# Patient Record
Sex: Male | Born: 1955 | Race: Black or African American | Hispanic: No | Marital: Single | Smoking: Former smoker
Health system: Southern US, Community
[De-identification: ages and names within clinical notes are randomized; demographics above are authoritative.]

## PROBLEM LIST (undated history)

## (undated) DIAGNOSIS — M79673 Pain in unspecified foot: Secondary | ICD-10-CM

## (undated) DIAGNOSIS — Z6827 Body mass index (BMI) 27.0-27.9, adult: Secondary | ICD-10-CM

## (undated) DIAGNOSIS — R413 Other amnesia: Secondary | ICD-10-CM

## (undated) DIAGNOSIS — I209 Angina pectoris, unspecified: Secondary | ICD-10-CM

## (undated) DIAGNOSIS — B351 Tinea unguium: Secondary | ICD-10-CM

## (undated) DIAGNOSIS — E559 Vitamin D deficiency, unspecified: Secondary | ICD-10-CM

## (undated) DIAGNOSIS — H269 Unspecified cataract: Secondary | ICD-10-CM

## (undated) DIAGNOSIS — F32A Depression, unspecified: Secondary | ICD-10-CM

## (undated) DIAGNOSIS — F411 Generalized anxiety disorder: Secondary | ICD-10-CM

## (undated) DIAGNOSIS — E663 Overweight: Secondary | ICD-10-CM

## (undated) DIAGNOSIS — F1410 Cocaine abuse, uncomplicated: Secondary | ICD-10-CM

## (undated) HISTORY — DX: Other amnesia: R41.3

## (undated) HISTORY — DX: Vitamin D deficiency, unspecified: E55.9

## (undated) HISTORY — DX: Generalized anxiety disorder: F41.1

## (undated) HISTORY — DX: Angina pectoris, unspecified: I20.9

## (undated) HISTORY — DX: Pain in unspecified foot: M79.673

## (undated) HISTORY — DX: Unspecified cataract: H26.9

## (undated) HISTORY — DX: Overweight: E66.3

## (undated) HISTORY — DX: Cocaine abuse, uncomplicated: F14.10

## (undated) HISTORY — DX: Tinea unguium: B35.1

## (undated) HISTORY — DX: Body mass index (BMI) 27.0-27.9, adult: Z68.27

## (undated) HISTORY — DX: Depression, unspecified: F32.A

---

## 2010-01-30 ENCOUNTER — Emergency Department (HOSPITAL_COMMUNITY): Admission: EM | Admit: 2010-01-30 | Discharge: 2010-01-30 | Payer: Self-pay | Admitting: Emergency Medicine

## 2019-05-28 ENCOUNTER — Other Ambulatory Visit: Payer: Self-pay | Admitting: *Deleted

## 2019-05-28 DIAGNOSIS — Z20822 Contact with and (suspected) exposure to covid-19: Secondary | ICD-10-CM

## 2019-05-28 NOTE — Progress Notes (Signed)
lab

## 2019-05-30 LAB — NOVEL CORONAVIRUS, NAA: SARS-CoV-2, NAA: NOT DETECTED

## 2019-05-31 NOTE — Progress Notes (Signed)
COVID Hotel Screening performed. COVID screening, temperature, and need for medical care and medications assessed. Patient agreed to the COVID-19 testing. No additional needs assessed at this time.  Kalysta Kneisley RN MSN 

## 2019-07-01 ENCOUNTER — Other Ambulatory Visit: Payer: Self-pay | Admitting: *Deleted

## 2019-07-01 DIAGNOSIS — Z20822 Contact with and (suspected) exposure to covid-19: Secondary | ICD-10-CM

## 2019-07-01 NOTE — Progress Notes (Signed)
la 

## 2019-07-01 NOTE — Addendum Note (Signed)
Addended by: Brigitte Pulse on: 07/01/2019 03:35 PM   Modules accepted: Orders

## 2019-07-03 NOTE — Addendum Note (Signed)
Addended by: Brigitte Pulse on: 07/03/2019 10:20 AM   Modules accepted: Orders

## 2020-01-28 ENCOUNTER — Other Ambulatory Visit: Payer: Self-pay

## 2020-01-28 DIAGNOSIS — Z20822 Contact with and (suspected) exposure to covid-19: Secondary | ICD-10-CM

## 2020-01-30 LAB — NOVEL CORONAVIRUS, NAA: SARS-CoV-2, NAA: NOT DETECTED

## 2020-06-05 ENCOUNTER — Emergency Department (HOSPITAL_COMMUNITY)
Admission: EM | Admit: 2020-06-05 | Discharge: 2020-06-06 | Disposition: A | Discharge status: Home / Self Care | Payer: Self-pay | Attending: Emergency Medicine | Admitting: Emergency Medicine

## 2020-06-05 DIAGNOSIS — F1494 Cocaine use, unspecified with cocaine-induced mood disorder: Secondary | ICD-10-CM | POA: Diagnosis present

## 2020-06-05 DIAGNOSIS — Z20822 Contact with and (suspected) exposure to covid-19: Secondary | ICD-10-CM | POA: Insufficient documentation

## 2020-06-05 DIAGNOSIS — R479 Unspecified speech disturbances: Secondary | ICD-10-CM | POA: Insufficient documentation

## 2020-06-05 DIAGNOSIS — R45851 Suicidal ideations: Secondary | ICD-10-CM | POA: Insufficient documentation

## 2020-06-05 DIAGNOSIS — Z5900 Homelessness unspecified: Secondary | ICD-10-CM

## 2020-06-05 DIAGNOSIS — Z1330 Encounter for screening examination for mental health and behavioral disorders, unspecified: Secondary | ICD-10-CM | POA: Insufficient documentation

## 2020-06-05 NOTE — ED Provider Notes (Signed)
[facility] DEPT Provider Note   CSN: [id number] Arrival date & time: 06/05/20  2217     History Chief Complaint  Patient presents with  . Suicidal    [name] is a 64 y.o. male with no significant past medical history who presents for evaluation under IVC.  Patient unwilling to answer questions.  Per IVC patient stating to DSS/ SW that he was going to slit his throat with a knife. Apparently banged his head on the wall 3 times for police in attempts to harm himself.   Level 5 Caveat- Patient unwilling to participate in exam or answer questions   HPI     History reviewed. No pertinent past medical history.  There are no problems to display for this patient.  History reviewed.    No family history on file.  Social History   Tobacco Use  . Smoking status: Not on file  Substance Use Topics  . Alcohol use: Not on file  . Drug use: Not on file    Home Medications Prior to Admission medications   Not on File    Allergies    Patient has no allergy information on record.  Review of Systems   Review of Systems  Unable to perform ROS: Patient nonverbal  All other systems reviewed and are negative.   Physical Exam Updated Vital Signs BP (!) 120/95 (BP Location: Right Arm)   Pulse 88   Temp 98.4 F (36.9 C) (Oral)   Resp 16   Ht 5\' 6"  (1.676 m)   Wt 77.1 kg   SpO2 98%   BMI 27.44 kg/m   Physical Exam Vitals and nursing note reviewed.  Constitutional:      General: He is not in acute distress.    Appearance: He is well-developed. He is not ill-appearing, toxic-appearing or diaphoretic.  HENT:     Head: Normocephalic and atraumatic.     Jaw: There is normal jaw occlusion.     Right Ear: No hemotympanum.     Left Ear: No hemotympanum.  Eyes:     Extraocular Movements: Extraocular movements intact.     Pupils: Pupils are equal, round, and reactive to light.     Comments: PERRLA. EOM intact  Cardiovascular:      Rate and Rhythm: Normal rate and regular rhythm.     Pulses: Normal pulses.     Heart sounds: Normal heart sounds.  Pulmonary:     Effort: Pulmonary effort is normal. No respiratory distress.     Breath sounds: Normal breath sounds.  Abdominal:     General: There is no distension.     Palpations: Abdomen is soft.  Musculoskeletal:        General: Normal range of motion.     Cervical back: Normal range of motion and neck supple.     Comments: Moves all 4 extremities without difficulty.  Skin:    General: Skin is warm and dry.  Neurological:     Mental Status: He is alert.     Comments: Ambulatory without difficulty. No facial droop    ED Results / Procedures / Treatments   Labs (all labs ordered are listed, but only abnormal results are displayed) Labs Reviewed  COMPREHENSIVE METABOLIC PANEL - Abnormal; Notable for the following components:      Result Value   Glucose, Bld 102 (*)    Total Protein 8.5 (*)    All other components within normal limits  CBC WITH DIFFERENTIAL/PLATELET - Abnormal; Notable  for the following components:   Hemoglobin 12.8 (*)    All other components within normal limits  SALICYLATE LEVEL - Abnormal; Notable for the following components:   Salicylate Lvl <7.0 (*)    All other components within normal limits  ACETAMINOPHEN LEVEL - Abnormal; Notable for the following components:   Acetaminophen (Tylenol), Serum <10 (*)    All other components within normal limits  SARS CORONAVIRUS 2 BY RT PCR (HOSPITAL ORDER, PERFORMED IN [facility])  ETHANOL  RAPID URINE DRUG SCREEN, HOSP PERFORMED    EKG EKG Interpretation  Date/Time:  Tuesday June 06 2020 00:59:24 EDT Ventricular Rate:  60 PR Interval:    QRS Duration: 104 QT Interval:  401 QTC Calculation: 401 R Axis:   -46 Text Interpretation: Sinus rhythm Left anterior fascicular block Abnormal R-wave progression, early transition 12 Lead; [place] No previous ECGs available Confirmed  by Glynn Octave [phone]) on 06/06/2020 1:07:46 AM   Radiology CT Head Wo Contrast  Result Date: 06/06/2020 CLINICAL DATA:  Head trauma. EXAM: CT HEAD WITHOUT CONTRAST TECHNIQUE: Contiguous axial images were obtained from the base of the skull through the vertex without intravenous contrast. COMPARISON:  None FINDINGS: Brain: No acute intracranial abnormality. Specifically, no hemorrhage, hydrocephalus, mass lesion, acute infarction, or significant intracranial injury. Vascular: No hyperdense vessel or unexpected calcification. Skull: No acute calvarial abnormality. Sinuses/Orbits: Visualized paranasal sinuses and mastoids clear. Orbital soft tissues unremarkable. Other: None IMPRESSION: Normal study. Electronically Signed   By: Charlett Nose M.D.   On: 06/06/2020 00:26    Procedures Procedures (including critical care time)  Medications Ordered in ED Medications  acetaminophen (TYLENOL) tablet 650 mg (has no administration in time range)  ondansetron (ZOFRAN) tablet 4 mg (has no administration in time range)   ED Course  I have reviewed the triage vital signs and the nursing notes.  Pertinent labs & imaging results that were available during my care of the patient were reviewed by me and considered in my medical decision making (see chart for details).  Patient presents under IVC for suicidal thoughts.  Per IVC.  Petitioner stated that he wanted to kill himself with a knife or jump off a bridge.  Myofascial evaluation patient unwilling to cooperate during exam or answer questions.  He is closing his eyes and has his arms crossed in front of him.  According to police patient hit the frontal aspect of his head on the side of a Bldg. 3 times and attempts to harm himself while in police custody.  No hemotympanum, raccoon eyes, battle sign.  Facial bones without tenderness, crepitus or step-offs.  EOMs intact. Plan on labs, imaging and reassess.  Labs and imaging personally viewed and  interpreted.  No significant abnormality.  On reevaluation is still refusing to answer questions and participate in exam.  Patient has ambulated without difficulty in room.  He moves all 4 extremities without difficulty.  Has asked nursing for food and drink. Low suspicion for acute intracranial or metabolic derangement as to why he will not answer questions with myself or regarding his medical care..  Patient medically cleared.  Disposition per TTS. Psych hold orders placed.  Patient is under IVC at this time.   MDM Rules/Calculators/A&P                           Final Clinical Impression(s) / ED Diagnoses Final diagnoses:  Suicidal ideation    Rx / DC Orders ED Discharge  Orders    None       Astaria Nanez A, PA-C 06/06/20 [id number]    Glynn Octave, MD 06/06/20 [phone]

## 2020-06-05 NOTE — ED Triage Notes (Signed)
Patient arrived under IVC due to stating he was suicidal. Patient refusing to answer any questions for this nurse. [employer] states when handcuffing patient he began banging his head in attempts to hurt himself.

## 2020-06-06 ENCOUNTER — Emergency Department (HOSPITAL_COMMUNITY): Payer: Self-pay

## 2020-06-06 ENCOUNTER — Encounter (HOSPITAL_COMMUNITY): Payer: Self-pay

## 2020-06-06 DIAGNOSIS — F1494 Cocaine use, unspecified with cocaine-induced mood disorder: Secondary | ICD-10-CM | POA: Diagnosis present

## 2020-06-06 DIAGNOSIS — Z5900 Homelessness unspecified: Secondary | ICD-10-CM

## 2020-06-06 LAB — CBC WITH DIFFERENTIAL/PLATELET
Abs Immature Granulocytes: 0.04 10*3/uL (ref 0.00–0.07)
Basophils Absolute: 0 10*3/uL (ref 0.0–0.1)
Basophils Relative: 0 %
Eosinophils Absolute: 0 10*3/uL (ref 0.0–0.5)
Eosinophils Relative: 0 %
HCT: 39.8 % (ref 39.0–52.0)
Hemoglobin: 12.8 g/dL — ABNORMAL LOW (ref 13.0–17.0)
Immature Granulocytes: 1 %
Lymphocytes Relative: 21 %
Lymphs Abs: 1.1 10*3/uL (ref 0.7–4.0)
MCH: 29 pg (ref 26.0–34.0)
MCHC: 32.2 g/dL (ref 30.0–36.0)
MCV: 90 fL (ref 80.0–100.0)
Monocytes Absolute: 0.4 10*3/uL (ref 0.1–1.0)
Monocytes Relative: 8 %
Neutro Abs: 3.8 10*3/uL (ref 1.7–7.7)
Neutrophils Relative %: 70 %
Platelets: 227 10*3/uL (ref 150–400)
RBC: 4.42 MIL/uL (ref 4.22–5.81)
RDW: 13.1 % (ref 11.5–15.5)
WBC: 5.4 10*3/uL (ref 4.0–10.5)
nRBC: 0 % (ref 0.0–0.2)

## 2020-06-06 LAB — COMPREHENSIVE METABOLIC PANEL
ALT: 15 U/L (ref 0–44)
AST: 16 U/L (ref 15–41)
Albumin: 4.6 g/dL (ref 3.5–5.0)
Alkaline Phosphatase: 65 U/L (ref 38–126)
Anion gap: 9 (ref 5–15)
BUN: 15 mg/dL (ref 8–23)
CO2: 25 mmol/L (ref 22–32)
Calcium: 9.3 mg/dL (ref 8.9–10.3)
Chloride: 109 mmol/L (ref 98–111)
Creatinine, Ser: 0.98 mg/dL (ref 0.61–1.24)
GFR calc Af Amer: >60 mL/min (ref >60)
GFR calc non Af Amer: >60 mL/min (ref >60)
Glucose, Bld: 102 mg/dL — ABNORMAL HIGH (ref 70–99)
Potassium: 3.9 mmol/L (ref 3.5–5.1)
Sodium: 143 mmol/L (ref 135–145)
Total Bilirubin: 0.6 mg/dL (ref 0.3–1.2)
Total Protein: 8.5 g/dL — ABNORMAL HIGH (ref 6.5–8.1)

## 2020-06-06 LAB — ACETAMINOPHEN LEVEL: Acetaminophen (Tylenol), Serum: <10 ug/mL — ABNORMAL LOW (ref 10–30)

## 2020-06-06 LAB — SALICYLATE LEVEL: Salicylate Lvl: <7 mg/dL — ABNORMAL LOW (ref 7.0–30.0)

## 2020-06-06 LAB — RAPID URINE DRUG SCREEN, HOSP PERFORMED
Amphetamines: NOT DETECTED
Barbiturates: NOT DETECTED
Benzodiazepines: NOT DETECTED
Cocaine: POSITIVE — AB
Opiates: NOT DETECTED
Tetrahydrocannabinol: NOT DETECTED

## 2020-06-06 LAB — ETHANOL: Alcohol, Ethyl (B): <10 mg/dL (ref <10)

## 2020-06-06 LAB — SARS CORONAVIRUS 2 BY RT PCR (HOSPITAL ORDER, PERFORMED IN ~~LOC~~ HOSPITAL LAB): SARS Coronavirus 2: NEGATIVE

## 2020-06-06 MED ORDER — ACETAMINOPHEN 325 MG PO TABS: 650.0000 mg | ORAL_TABLET | ORAL | Status: DC | PRN

## 2020-06-06 MED ORDER — ONDANSETRON HCL 4 MG PO TABS: 4.0000 mg | ORAL_TABLET | Freq: Three times a day (TID) | ORAL | Status: DC | PRN

## 2020-06-06 NOTE — BH Assessment (Signed)
Comprehensive Clinical Assessment (CCA) Note  06/06/2020 Richard Nguyen [id number]  Visit Diagnosis:      ICD-10-CM   1. Suicidal ideation  R45.851       CCA Screening, Triage and Referral (STR)  Patient Reported Information How did you hear about [place]? DSS  Referral name: Patient's social worker  Referral phone number: No data recorded  Whom do you see for routine medical problems? I don't have a doctor  Practice/Facility Name: No data recorded Practice/Facility Phone Number: No data recorded Name of Contact: No data recorded Contact Number: No data recorded Contact Fax Number: No data recorded Prescriber Name: No data recorded Prescriber Address (if known): No data recorded  What Is the Reason for Your Visit/Call Today? IVC  How Long Has This Been Causing You Problems? <Week  What Do You Feel Would Help You the Most Today? Other (Comment) (patient states he does not need services)   Have You Recently Been in Any Inpatient Treatment (Hospital/Detox/Crisis Center/28-Day Program)? No  Name/Location of Program/Hospital:No data recorded How Long Were You There? No data recorded When Were You Discharged? No data recorded  Have You Ever Received Services From [facility] Before? No  Who Do You See at [facility]? No data recorded  Have You Recently Had Any Thoughts About Hurting Yourself? No  Are You Planning to Commit Suicide/Harm Yourself At This time? No   Have you Recently Had Thoughts About Hurting Someone [name]? No  Explanation: No data recorded  Have You Used Any Alcohol or Drugs in the Past 24 Hours? No  How Long Ago Did You Use Drugs or Alcohol? No data recorded What Did You Use and How Much? No data recorded  Do You Currently Have a Therapist/Psychiatrist? No  Name of Therapist/Psychiatrist: No data recorded  Have You Been Recently Discharged From Any Office Practice or Programs? No  Explanation of Discharge From Practice/Program: No data  recorded    CCA Screening Triage Referral Assessment Type of Contact: Face-to-Face  Is this Initial or Reassessment? No data recorded Date Telepsych consult ordered in [facility]:  No data recorded Time Telepsych consult ordered in [facility]:  No data recorded  Patient Reported Information Reviewed? Yes  Patient Left Without Being Seen? No data recorded Reason for Not Completing Assessment: No data recorded  Collateral Involvement: patient denies   Does Patient Have a Court Appointed Legal Guardian? No data recorded Name and Contact of Legal Guardian: No data recorded If Minor and Not Living with Parent(s), Who has Custody? No data recorded Is CPS involved or ever been involved? Never  Is APS involved or ever been involved? Never   Patient Determined To Be At Risk for Harm To Self or Others Based on Review of Patient Reported Information or Presenting Complaint? No  Method: No data recorded Availability of Means: No data recorded Intent: No data recorded Notification Required: No data recorded Additional Information for Danger to Others Potential: No data recorded Additional Comments for Danger to Others Potential: No data recorded Are There Guns or Other Weapons in Your Home? No data recorded Types of Guns/Weapons: No data recorded Are These Weapons Safely Secured?                            No data recorded Who Could Verify You Are Able To Have These Secured: No data recorded Do You Have any Outstanding Charges, Pending Court Dates, Parole/Probation? No data recorded Contacted To Inform of Risk of  Harm To Self or Others: No data recorded  Location of Assessment: [facility]   Does Patient Present under Involuntary Commitment? Yes  IVC Papers Initial File Date: 06/05/20   [place] of Residence: [place]   Patient Currently Receiving the Following Services: Not Receiving Services   Determination of Need: Routine (7 days)   Options For Referral: Outpatient Therapy;Medication  Management     CCA Biopsychosocial  Intake/Chief Complaint:  CCA Intake With Chief Complaint CCA Part Two Date: 06/06/20 Chief Complaint/Presenting Problem: IVC Patient's Currently Reported Symptoms/Problems: not assessed Individual's Strengths: not assessed Individual's Preferences: not assessed Individual's Abilities: not assessed Type of Services Patient Feels Are Needed: not assessed Initial Clinical Notes/Concerns: None noted  Mental Health Symptoms Depression:  Depression: Irritability  Mania:  Mania: None  Anxiety:   Anxiety: None  Psychosis:  Psychosis: None  Trauma:  Trauma: None  Obsessions:  Obsessions: None  Compulsions:     Inattention:  Inattention: None  Hyperactivity/Impulsivity:     Oppositional/Defiant Behaviors:  Oppositional/Defiant Behaviors: None  Emotional Irregularity:  Emotional Irregularity: None  Other Mood/Personality Symptoms:      Mental Status Exam Appearance and self-care  Stature:  Stature: Average  Weight:  Weight: Average weight  Clothing:  Clothing: Disheveled  Grooming:  Grooming: Neglected  Cosmetic use:  Cosmetic Use: None  Posture/gait:  Posture/Gait: Normal  Motor activity:  Motor Activity: Not Remarkable  Sensorium  Attention:  Attention: Normal  Concentration:  Concentration: Normal  Orientation:  Orientation: X5  Recall/memory:  Recall/Memory: Normal  Affect and Mood  Affect:  Affect: Full Range  Mood:  Mood: Irritable  Relating  Eye contact:  Eye Contact: Avoided  Facial expression:  Facial Expression:  (lethargic)  Attitude toward examiner:  Attitude Toward Examiner: Guarded  Thought and Language  Speech flow: Speech Flow: Soft  Thought content:  Thought Content: Appropriate to Mood and Circumstances  Preoccupation:  Preoccupations: None  Hallucinations:  Hallucinations: None  Organization:     Company secretary of Knowledge:  Fund of Knowledge: Average  Intelligence:  Intelligence: Average   Abstraction:  Abstraction: Normal  Judgement:  Judgement: Fair  Dance movement psychotherapist:  Reality Testing: Realistic  Insight:  Insight: Fair  Decision Making:  Decision Making: Normal  Social Functioning  Social Maturity:  Social Maturity:  Industrial/product designer)  Social Judgement:  Social Judgement:  (UTA)  Stress  Stressors:  Stressors: Housing  Coping Ability:  Coping Ability: Normal  Skill Deficits:  Skill Deficits: None  Supports:  Supports: Other (Comment), Support needed (IRC)     Religion: Religion/Spirituality Are You A Religious Person?: No  Leisure/Recreation: Leisure / Recreation Do You Have Hobbies?: No  Exercise/Diet: Exercise/Diet Do You Exercise?: No Have You Gained or Lost A Significant Amount of Weight in the Past Six Months?: No Do You Follow a Special Diet?: No Do You Have Any Trouble Sleeping?: No   CCA Employment/Education  Employment/Work Situation: Employment / Work Situation Employment situation: Unemployed What is the longest time patient has a held a job?: UTA Where was the patient employed at that time?: UTA Has patient ever been in the [employer]?: No  Education: Education Is Patient Currently Attending School?: No   CCA Family/Childhood History  Family and Relationship History: Family history What is your sexual orientation?: not assessed Has your sexual activity been affected by drugs, alcohol, medication, or emotional stress?: not assessed Does patient have children?:  (not assessed)  Childhood History:  Childhood History By whom was/is the patient raised?:  (not  assessed) Additional childhood history information: not assessed Description of patient's relationship with caregiver when they were a child: not assessed Patient's description of current relationship with people who raised him/her: not assessed How were you disciplined when you got in trouble as a child/adolescent?: not assessed Does patient have siblings?:  (UAT) Did patient suffer any  verbal/emotional/physical/sexual abuse as a child?: No Did patient suffer from severe childhood neglect?: No Has patient ever been sexually abused/assaulted/raped as an adolescent or adult?: No Was the patient ever a victim of a crime or a disaster?:  (not assessed) Witnessed domestic violence?:  (not assessed) Has patient been affected by domestic violence as an adult?:  (not assessed)  Child/Adolescent Assessment:     CCA Substance Use  Alcohol/Drug Use: Alcohol / Drug Use Pain Medications: see MAR Prescriptions: see MAR Over the Counter: see MAR History of alcohol / drug use?: Yes Substance #1 Name of Substance 1: Cocaine 1 - Age of First Use: UTA 1 - Amount (size/oz): UTA 1 - Frequency: UTA 1 - Duration: UTA 1 - Last Use / Amount: UTA                       ASAM's:  Six Dimensions of Multidimensional Assessment  Dimension 1:  Acute Intoxication and/or Withdrawal Potential:      Dimension 2:  Biomedical Conditions and Complications:      Dimension 3:  Emotional, Behavioral, or Cognitive Conditions and Complications:     Dimension 4:  Readiness to Change:     Dimension 5:  Relapse, Continued use, or Continued Problem Potential:     Dimension 6:  Recovery/Living Environment:     ASAM Severity Score:    ASAM Recommended Level of Treatment:     Substance use Disorder (SUD)    Recommendations for Services/Supports/Treatments:    DSM5 Diagnoses: Patient Active Problem List   Diagnosis Date Noted  . Homelessness 06/06/2020  . 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

## 2020-06-06 NOTE — Discharge Instructions (Signed)
To help you maintain a sober lifestyle, a substance abuse treatment program may be beneficial to you.  Contact one of the following facilities at your earliest opportunity to ask about enrolling:  RESIDENTIAL PROGRAMS:       [facility]      [address]      [place], [place] [address]      [phone]       [facility]      [address]      [place], [place] [address]      [phone]       [facility]      [address]      [place],  [address]      [phone]  OUTPATIENT PROGRAMS:       [facility]      [address]      [place],  [address]      [phone]       New patients are seen at their walk-in clinic.  Walk-in hours are Monday - Friday from 8:30 am - 12:00 pm, and from 1:00 pm - 2:30 pm.  Walk-in patients are seen on a first come, first served basis, so try to arrive as early as possible for the best chance of being seen the same day. 

## 2020-06-06 NOTE — BHH Suicide Risk Assessment (Cosign Needed)
Suicide Risk Assessment  Discharge Assessment   [facility] Discharge Suicide Risk Assessment   Principal Problem: <principal problem not specified> Discharge Diagnoses: Active Problems:   Homelessness   Cocaine-induced mood disorder (HCC)   Total Time spent with patient: 30 minutes  Musculoskeletal: Strength & Muscle Tone: within normal limits Gait & Station: normal Patient leans: N/A  Psychiatric Specialty Exam:   Blood pressure (!) 120/95, pulse 88, temperature 98.4 F (36.9 C), temperature source Oral, resp. rate 16, height 5\' 6"  (1.676 m), weight 77.1 kg, SpO2 98 %.Body mass index is 27.44 kg/m.  General Appearance: Casual  Eye Contact::  Fair  Speech:  Clear and Coherent and Normal Rate409  Volume:  Normal  Mood:  Fine"  Affect:  Appropriate and Congruent  Thought Process:  Coherent, Goal Directed and Descriptions of Associations: Intact  Orientation:  Full (Time, Place, and Person)  Thought Content:  WDL  Suicidal Thoughts:  No  Homicidal Thoughts:  No  Memory:  Immediate;   Good Recent;   Good  Judgement:  Intact  Insight:  Fair and Present  Psychomotor Activity:  Normal  Concentration:  Good  Recall:  Good  Fund of Knowledge:Fair  Language: Good  Akathisia:  No  Handed:  Right  AIMS (if indicated):     Assets:  Communication Skills Desire for Improvement  Sleep:     Cognition: WNL  ADL's:  Intact   Mental Status Per Nursing Assessment::   On Admission:    Richard Nguyen, 64 y.o., male patient seen face to face by this provider, consulted with Dr. 68; and chart reviewed on 06/06/20.  On evaluation Richard Nguyen reports that nothing is wrong with him and he is not sure why he was brought to the to the hospital.  During evaluation Richard Nguyen is alert/oriented x 4; calm/cooperative; and mood is congruent with affect.  He does not appear to be responding to internal/external stimuli or delusional thoughts.  Patient denies suicidal/self-harm/homicidal  ideation, psychosis, and paranoia.  Patient answered question appropriately.  Patient states that he has been provided a motel room by [facility].  States that he is not interested in outpatient or rehab service.     Demographic Factors:  Male  Loss Factors: Financial problems/change in socioeconomic status  Historical Factors: Impulsivity  Risk Reduction Factors:   Religious beliefs about death  Continued Clinical Symptoms:  Alcohol/Substance Abuse/Dependencies  Cognitive Features That Contribute To Risk:  None    Suicide Risk:  Minimal: No identifiable suicidal ideation.  Patients presenting with no risk factors but with morbid ruminations; may be classified as minimal risk based on the severity of the depressive symptoms    Plan Of Care/Follow-up recommendations:  Activity:  As tolerated Diet:  Heart healthy     Discharge Instructions     To help you maintain a sober lifestyle, a substance abuse treatment program may be beneficial to you.  Contact one of the following facilities at your earliest opportunity to ask about enrolling:  RESIDENTIAL PROGRAMS:       [facility]      [address] [place], [place]      [phone]       [facility]      [address]      [place], [place]      [phone]  OUTPATIENT PROGRAMS:  To help you maintain a sober lifestyle, a substance abuse treatment program may be beneficial to you.  Contact  [facility] at your earliest opportunity to ask about enrolling in their program:       [facility]      [place], [place] [address]      [phone]      New patients must call to schedule an intake appointment Mondays, Wednesdays and Fridays, 1:00 pm - 3:00 pm    Disposition:  Psychiatrically cleared No evidence of imminent risk to self or others at present.   Patient does not meet criteria for psychiatric inpatient admission. Supportive  therapy provided about ongoing stressors. Discussed crisis plan, support from social network, calling 911, coming to the Emergency Department, and calling Suicide Hotline.  Madox Corkins, NP 06/06/2020, 2:33 PM

## 2020-06-06 NOTE — BH Assessment (Signed)
BHH Assessment Progress Note   Per Nelly Rout, MD, this pt does not require psychiatric hospitalization at this time.  Pt presents under IVC initiated by [employer] staff and initially upheld by EDP Glynn Octave, MD, but now rescinded by Nelly Rout, MD.  Pt is to be discharged from [facility] with referral information for area substance abuse treatment providers.  This has been included in pt's discharge instructions.  Pt's nurse has been notified.  Doylene Canning, MA Triage Specialist [phone]

## 2021-10-02 IMAGING — CT CT HEAD W/O CM
3 series · 16 of 47 positions shown, 19 images · non-contrast
Comparison: None

CLINICAL DATA: Head trauma.

EXAM:
CT HEAD WITHOUT CONTRAST
TECHNIQUE: Contiguous axial images were obtained from the base of the skull
through the vertex without intravenous contrast.

[Series 2: head wo · axial · 0.47mm/px · z∈[+1401,+1536]mm · 10 of 33 slices shown, 13 images]
[im 3/33  brain]
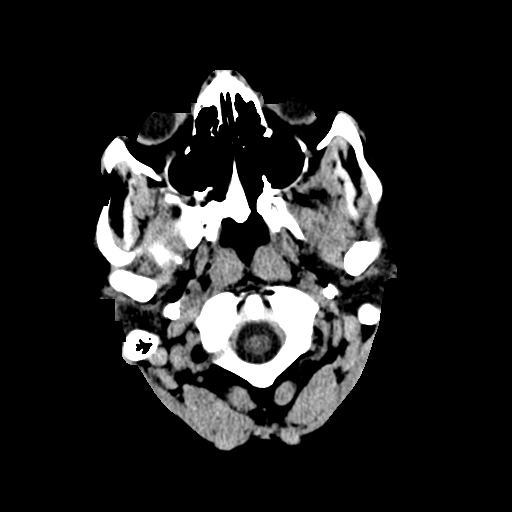
[im 3/33  bone]
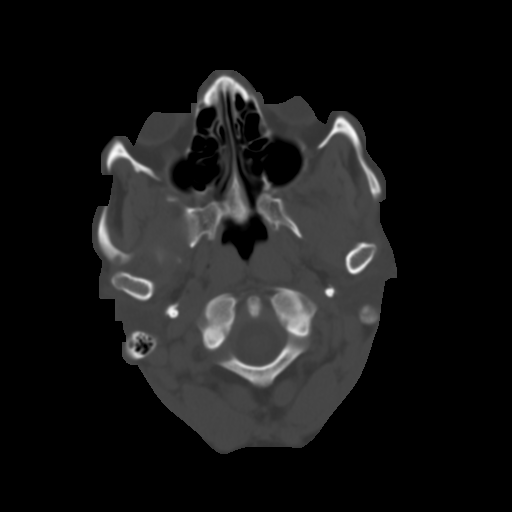
[im 6/33  brain]
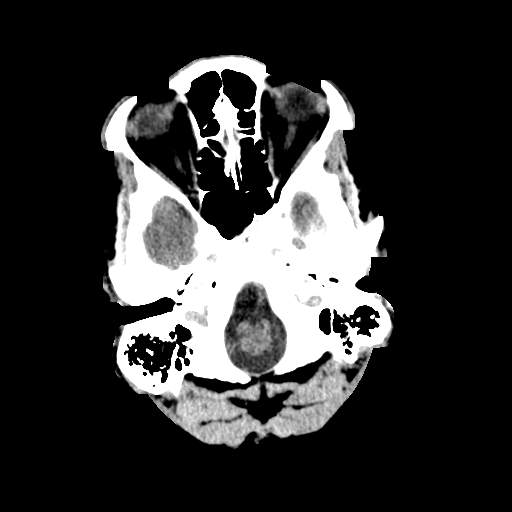
[im 9/33  brain]
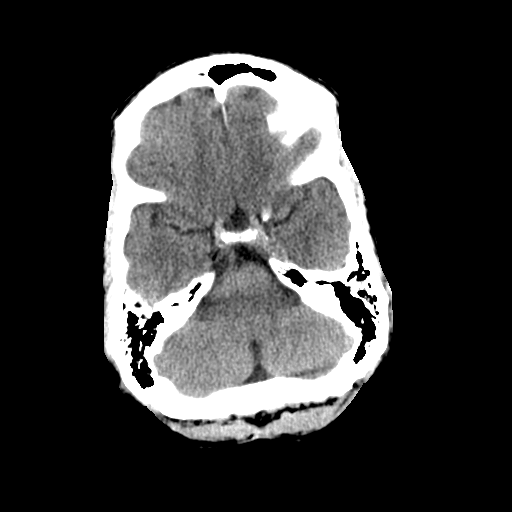
[im 12/33  brain]
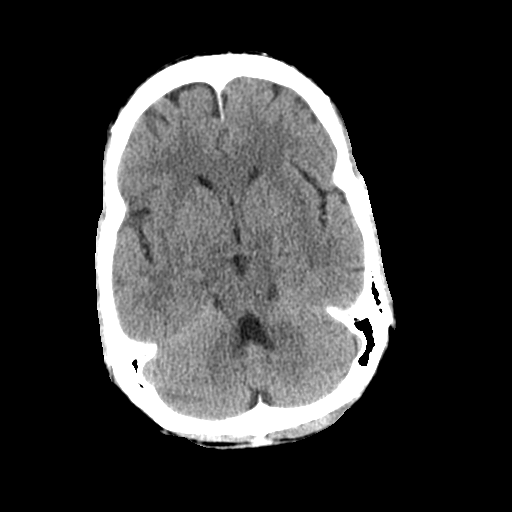
[im 15/33  brain]
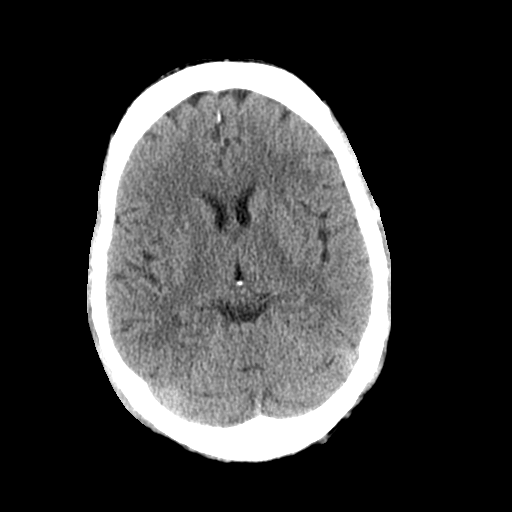
[im 15/33  bone]
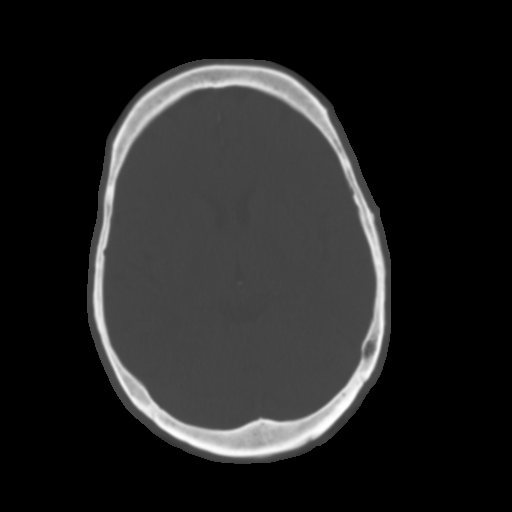
[im 18/33  brain]
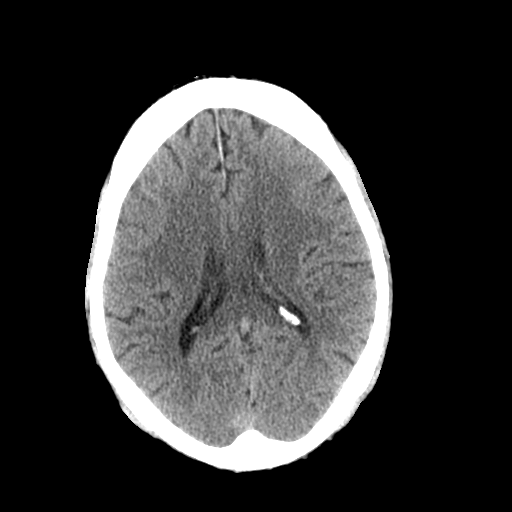
[im 21/33  brain]
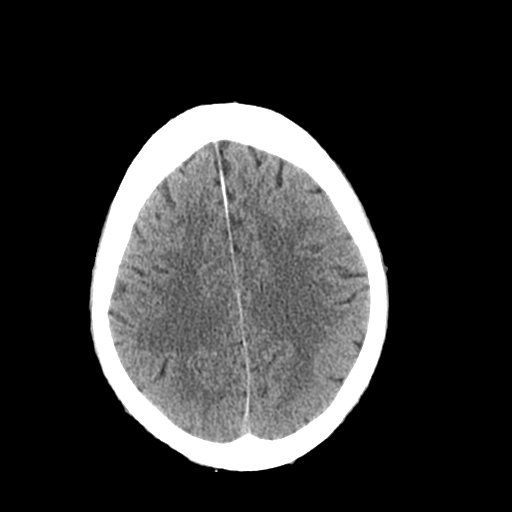
[im 25/33  brain]
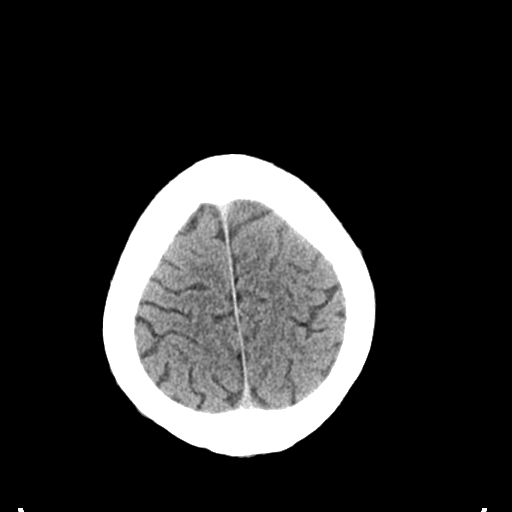
[im 27/33  brain]
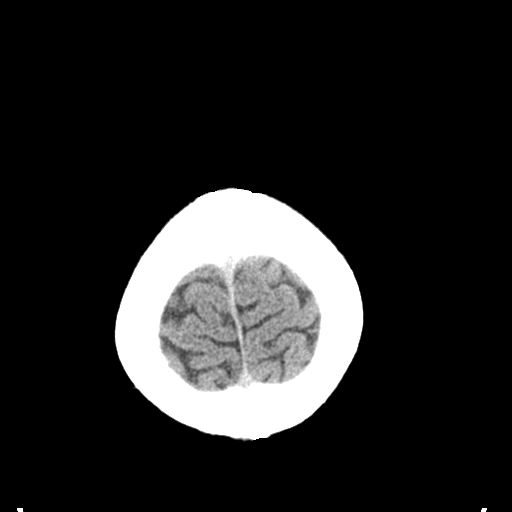
[im 27/33  bone]
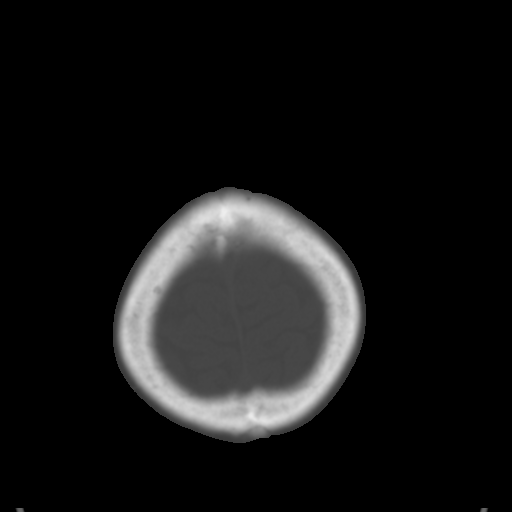
[im 30/33  brain]
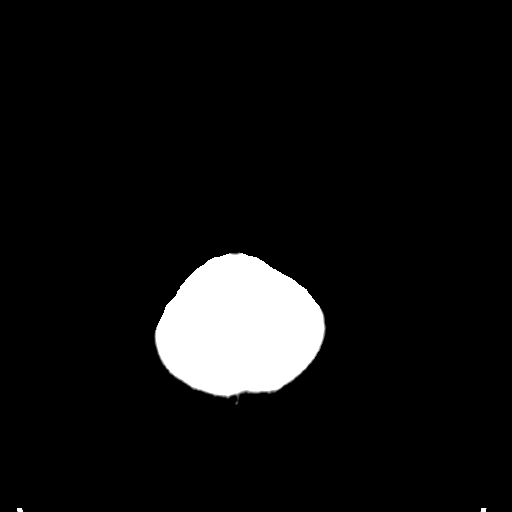

[Series 4: coronal soft tissue · coronal · 0.32mm/px · 3 of 69 slices shown]
[im 23/69  brain]
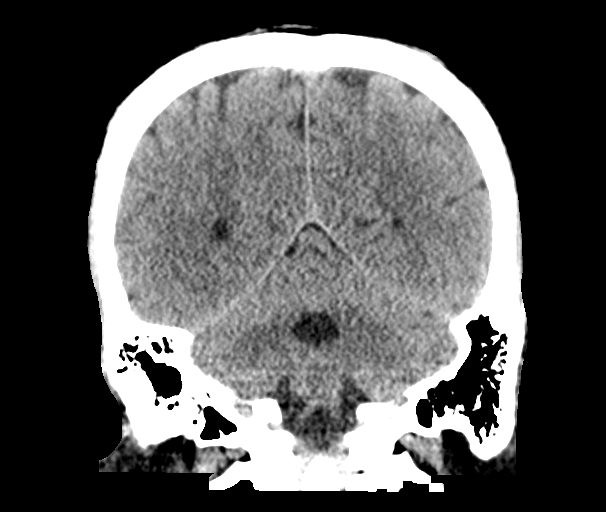
[im 31/69  brain]
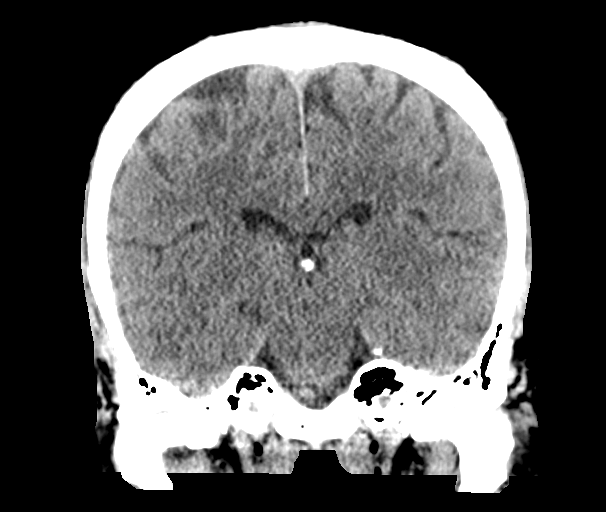
[im 38/69  brain]
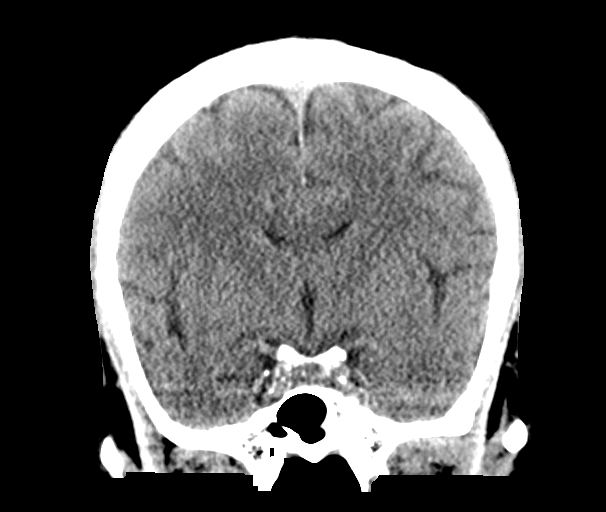

[Series 5: sagittal soft tissue · sagittal · 0.32mm/px · 3 of 65 slices shown]
[im 22/65  brain]
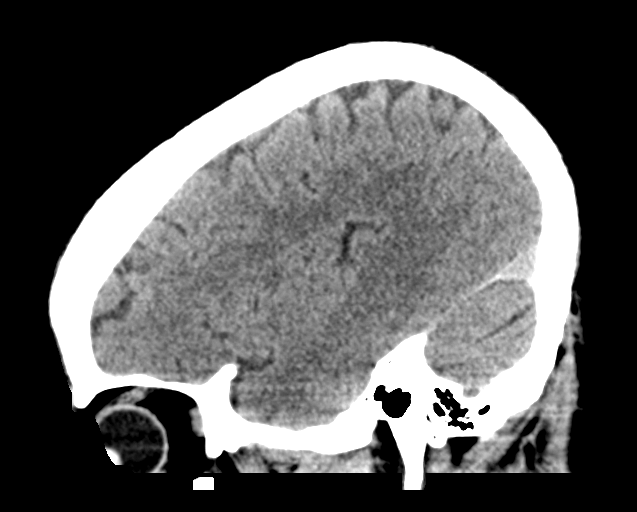
[im 33/65  brain]
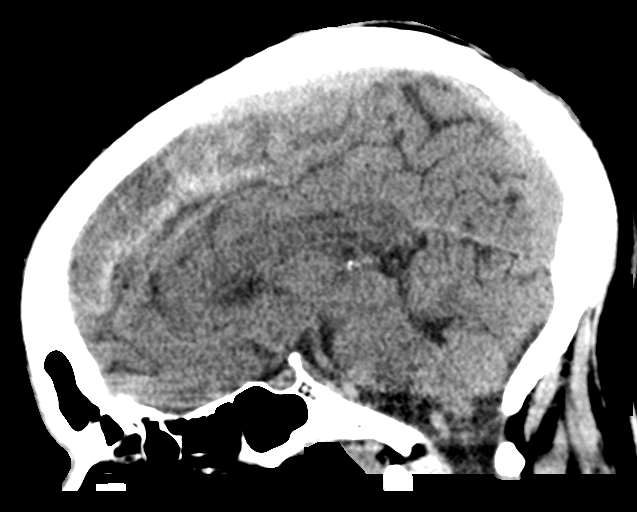
[im 43/65  brain]
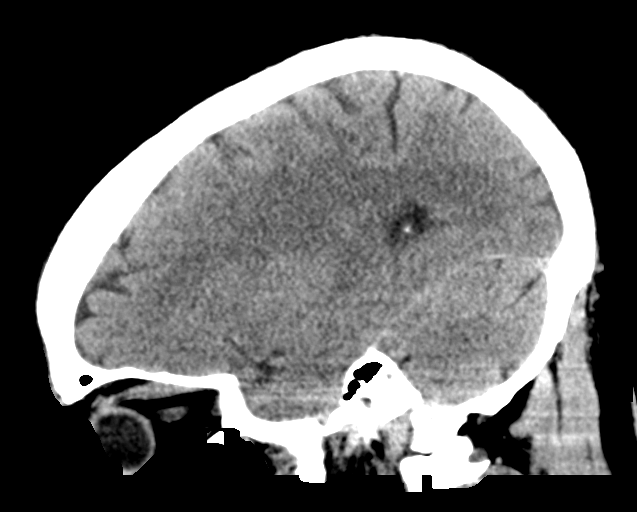

[16 of 47 positions shown; findings below may reference images not displayed]

FINDINGS: Brain: No acute intracranial abnormality. Specifically, no
hemorrhage, hydrocephalus, mass lesion, acute infarction, or
significant intracranial injury.

Vascular: No hyperdense vessel or unexpected calcification.

Skull: No acute calvarial abnormality.

Sinuses/Orbits: Visualized paranasal sinuses and mastoids clear.
Orbital soft tissues unremarkable.

Other: None
IMPRESSION: Normal study.

## 2021-11-14 ENCOUNTER — Encounter: Payer: Self-pay | Admitting: *Deleted

## 2021-11-14 NOTE — Congregational Nurse Program (Signed)
  Dept: [phone]   Congregational Nurse Program Note  Date of Encounter: 11/14/2021  Past Medical History: No past medical history on file.  Encounter Details:  CNP Questionnaire - 11/14/21 0956       Questionnaire   Do you give verbal consent to treat you today? Yes    Location Patient Served  [facility]    Visit Setting Church or Organization    Patient Status Homeless    Insurance Uninsured ([employer] Card/Care Connects/Self-Pay)    Insurance Referral N/A    Medication N/A    Medical Provider Yes    Screening Referrals N/A    Medical Referral Vaccination    Medical Appointment Made N/A    Food N/A    Transportation Provided transportation assistance    Housing/Utilities N/A    Interpersonal Safety N/A    Intervention Support    ED Visit Averted N/A    Life-Saving Intervention Made N/A            Client seen in lobby requesting help getting a Covid Vaccine. Checked with local pharmacies. [facility] [address] is giving [place] and [facility] [address] has [employer]. Gave two bus passes for transportation to get his vaccine. Joni Norrod W RN CN

## 2021-11-21 ENCOUNTER — Encounter: Payer: Self-pay | Admitting: *Deleted

## 2021-11-21 NOTE — Congregational Nurse Program (Signed)
  Dept: [phone]   Congregational Nurse Program Note  Date of Encounter: 11/21/2021  Past Medical History: No past medical history on file.  Encounter Details:  CNP Questionnaire - 11/21/21 0951       Questionnaire   Do you give verbal consent to treat you today? Yes    Location Patient Served  [facility]    Visit Setting Church or [address]Phone/Text/Email    Patient Status Homeless    Insurance Uninsured ([employer] Card/Care Connects/Self-Pay)    Insurance Referral N/A    Medication N/A    Medical Provider Yes    Screening Referrals N/A    Medical Referral Vaccination    Medical Appointment Made Other    Food N/A    Transportation Provided transportation assistance    Housing/Utilities N/A    Interpersonal Safety N/A    Intervention Support;Navigate Healthcare System    ED Visit Averted N/A    Life-Saving Intervention Made N/A            Client reports he went to two pharmacies last week and was unable to get his covid vaccine. Client reports he needs help making an appointment. [facility]isted client [facility] [address] Appointments accepted online. Client reports he had his last booster at that pharmacy over six months ago. Appointment scheduled today at 4:00. Gave two bus passes for transportation. Autumm Hattery W RN CN

## 2021-12-21 ENCOUNTER — Encounter: Payer: Self-pay | Admitting: *Deleted

## 2021-12-21 NOTE — Congregational Nurse Program (Signed)
°  Dept: [phone]   Congregational Nurse Program Note  Date of Encounter: 12/21/2021  Past Medical History: No past medical history on file.  Encounter Details:  CNP Questionnaire - 12/21/21 1058       Questionnaire   Do you give verbal consent to treat you today? Yes    Location Patient Served  [facility]    Visit Setting Church or Organization    Patient Status Homeless    Insurance Uninsured ([employer] Card/Care Connects/Self-Pay)    Insurance Referral N/A    Medication N/A    Medical Provider Yes    Screening Referrals N/A    Medical Referral N/A    Medical Appointment Made N/A    Food N/A    Transportation N/A    Housing/Utilities N/A    Interpersonal Safety N/A    Intervention Support;Navigate Healthcare System    ED Visit Averted N/A    Life-Saving Intervention Made N/A            Client came to nurse's office requesting help completing medicare enrollment papers. Assisted client and came to section requesting him to choose options listed in book. Client left his book at his boarding house. He will bring the book later. Orthoptist would feel more comfortable to have SW or CM to look over after completed and before he submits. Jalacia Mattila W RN CN

## 2022-08-21 ENCOUNTER — Encounter (HOSPITAL_COMMUNITY): Payer: Self-pay | Admitting: Registered Nurse

## 2022-08-21 ENCOUNTER — Ambulatory Visit (HOSPITAL_COMMUNITY)
Admission: EM | Admit: 2022-08-21 | Discharge: 2022-08-21 | Disposition: A | Discharge status: Home / Self Care | Payer: No Payment, Other | Attending: Registered Nurse | Admitting: Registered Nurse

## 2022-08-21 DIAGNOSIS — Z765 Malingerer [conscious simulation]: Secondary | ICD-10-CM | POA: Insufficient documentation

## 2022-08-21 DIAGNOSIS — Z592 Discord with neighbors, lodgers and landlord: Secondary | ICD-10-CM

## 2022-08-21 DIAGNOSIS — R45851 Suicidal ideations: Secondary | ICD-10-CM | POA: Insufficient documentation

## 2022-08-21 DIAGNOSIS — F1494 Cocaine use, unspecified with cocaine-induced mood disorder: Secondary | ICD-10-CM | POA: Insufficient documentation
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
## 2022-08-21 NOTE — Discharge Instructions (Addendum)
Safety Plan Richard Nguyen will reach out to [facility] staff (case manager, NP), call 911 or call mobile crisis, or go to nearest emergency room if condition worsens or if suicidal thoughts become active Patients' will follow up with resources for outpatient psychiatric services (therapy/medication management).  The suicide prevention education provided includes the following: Suicide risk factors Suicide prevention and interventions National Suicide Hotline telephone number [facility] assessment telephone number [facility] Emergency Assistance 911 [facility] and/or Residential Mobile Crisis Unit telephone number Request made of family/significant other to:  Richard Nguyen Remove weapons (e.g., guns, rifles, knives), all items previously/currently identified as safety concern.   Remove drugs/medications (over the counter, prescriptions, illicit drugs), all items previously/currently identified as a safety concern.     [facility]: Outpatient psychiatric Services  New Patient Assessment and Therapy Walk-in Monday thru Thursday 8:00 am first come first serve until slots are full Every Friday from 1:00 pm to 4:00 pm first come first serve until slots are full  New Patient Psychiatric Medication Management Monday thru Friday from 8:00 am to 11:00 am first come first served until slots are full  For all walk-ins we ask that you arrive by 7:15 am because patients will be seen in there order of arrival.   Availability is limited, and therefore you may not be seen on the same day that you walk in.  Our goal is to serve and meet the needs of our community to the best of our ability.
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
## 2022-08-23 ENCOUNTER — Encounter: Payer: Self-pay | Admitting: *Deleted
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
## 2023-04-14 ENCOUNTER — Other Ambulatory Visit: Payer: Self-pay | Admitting: Nurse Practitioner

## 2023-04-14 DIAGNOSIS — R413 Other amnesia: Secondary | ICD-10-CM

## 2023-04-15 ENCOUNTER — Encounter: Payer: Self-pay | Admitting: Nurse Practitioner

## 2023-04-28 ENCOUNTER — Ambulatory Visit (INDEPENDENT_AMBULATORY_CARE_PROVIDER_SITE_OTHER): Payer: Medicare HMO | Admitting: Podiatry

## 2023-04-28 DIAGNOSIS — B351 Tinea unguium: Secondary | ICD-10-CM

## 2023-04-28 DIAGNOSIS — M79674 Pain in right toe(s): Secondary | ICD-10-CM | POA: Diagnosis not present

## 2023-04-28 DIAGNOSIS — B353 Tinea pedis: Secondary | ICD-10-CM | POA: Diagnosis not present

## 2023-04-28 DIAGNOSIS — M79675 Pain in left toe(s): Secondary | ICD-10-CM | POA: Diagnosis not present

## 2023-04-28 MED ORDER — KETOCONAZOLE 2 % EX CREA: 1.0000 | TOPICAL_CREAM | Freq: Every day | CUTANEOUS | 3 refills | Status: AC

## 2023-04-28 NOTE — Progress Notes (Signed)
       Subjective:  Patient ID: Richard Nguyen, male    DOB: 02/10/1956,  MRN: [id number]  Richard Nguyen presents to clinic today for:  Chief Complaint  Patient presents with   Nail Problem    Discolored nails pt would like a nails trimmed   . Patient notes nails are thick, discolored, elongated and painful in shoegear when trying to ambulate.  Also notes itching between toes that is chronic and recurring.  Has no cream at home to use between toes.  PCP is Place[facility], Chales Abrahams, NP.  Review of Systems: Negative except as noted in the HPI.  Objective: No changes noted in today's physical examination. There were no vitals filed for this visit.  Richard Nguyen is a pleasant 67 y.o. male in NAD. AAO x 3.  Vascular Examination: Capillary refill time is 3-5 seconds to toes bilateral. Palpable pedal pulses b/l LE. Digital hair present b/l. No pedal edema b/l. Skin temperature gradient WNL b/l. No varicosities b/l. No cyanosis or clubbing noted b/l.   Dermatological Examination: Pedal skin with normal turgor, texture and tone b/l. No open wounds. Toenails 1-5 b/l thickened 4mm, discolored, dystrophic with subungual debris. There is pain with compression of the nail plates.  They are elongated x10.  Left 4th interspace, and right 2nd through 4th interspaces have white maceration and peeling present.    Neurological Examination: Protective sensation intact with Semmes-Weinstein 10 gram monofilament b/l LE. Vibratory sensation intact b/l LE.  Musculoskeletal Examination: Muscle strength 5/5 to all LE muscle groups b/l.    Assessment/Plan: 1. Dermatophytosis of nail   2. Toe pain, bilateral   3. Tinea pedis of both feet     Meds ordered this encounter  Medications   ketoconazole (NIZORAL) 2 % cream    Sig: Apply 1 Application topically daily.    Dispense:  60 g    Refill:  3   Discussed topical tolcylen solution daily to all toenails for 6-12 month course to treat the nail fungus.    Patient will apply ketoconazole cream daily between the affected toes until tinea has resolved.   Return in about 3 months (around 07/29/2023) for fungal nails.   Clerance Lav, DPM, FACFAS [facility]     [address] [place], [place] [address]                Office [phone]  Fax [phone]

## 2023-05-05 ENCOUNTER — Other Ambulatory Visit: Payer: Medicare HMO

## 2023-06-24 ENCOUNTER — Other Ambulatory Visit: Payer: Self-pay | Admitting: Nurse Practitioner

## 2023-06-24 DIAGNOSIS — D329 Benign neoplasm of meninges, unspecified: Secondary | ICD-10-CM

## 2023-06-24 DIAGNOSIS — R413 Other amnesia: Secondary | ICD-10-CM

## 2023-07-29 ENCOUNTER — Encounter: Payer: Medicaid Other | Admitting: Podiatry

## 2023-07-31 NOTE — Progress Notes (Signed)
Patient no-showed for today's scheduled appt

## 2023-08-15 ENCOUNTER — Encounter: Payer: Self-pay | Admitting: Nurse Practitioner

## 2023-08-24 ENCOUNTER — Other Ambulatory Visit: Payer: Medicare HMO

## 2023-10-14 ENCOUNTER — Ambulatory Visit
Admission: RE | Admit: 2023-10-14 | Discharge: 2023-10-14 | Disposition: A | Discharge status: Home / Self Care | Payer: 59 | Source: Ambulatory Visit | Attending: Nurse Practitioner | Admitting: Nurse Practitioner

## 2023-10-14 DIAGNOSIS — R413 Other amnesia: Secondary | ICD-10-CM

## 2023-10-14 DIAGNOSIS — D329 Benign neoplasm of meninges, unspecified: Secondary | ICD-10-CM

## 2023-10-14 MED ORDER — GADOPICLENOL 0.5 MMOL/ML IV SOLN
7.5000 mL | Freq: Once | INTRAVENOUS | Status: AC | PRN
  Administered 2023-10-14: 7.5 mL via INTRAVENOUS

## 2023-11-04 ENCOUNTER — Ambulatory Visit: Payer: Medicaid Other

## 2023-11-04 NOTE — Progress Notes (Unsigned)
Mr. Trad has F/U scheduled with cardiology on 01/17/24 Pt has been seen by [facility] in past. Notice of Privacy Practices reviewed and consent given. Hard copy of health screening form scanned into media tab.

## 2023-11-18 ENCOUNTER — Encounter: Payer: Self-pay | Admitting: Nurse Practitioner

## 2023-11-18 ENCOUNTER — Ambulatory Visit: Payer: Self-pay | Admitting: Nurse Practitioner

## 2023-11-18 VITALS — BP 113/56 | HR 53 | Temp 97.9°F | Resp 18 | Ht 66.0 in | Wt 163.0 lb

## 2023-11-18 DIAGNOSIS — Z09 Encounter for follow-up examination after completed treatment for conditions other than malignant neoplasm: Secondary | ICD-10-CM

## 2023-11-18 NOTE — Progress Notes (Signed)
Pt seen for weekly checkup. Has appt with social security 12/04/23 at 9am.

## 2023-12-02 ENCOUNTER — Ambulatory Visit: Payer: Medicaid Other | Admitting: Nurse Practitioner

## 2023-12-02 NOTE — Progress Notes (Signed)
Pt. seen for weekly VS check. [place] [facility] appt rescheduled for 12/16 at 1:30. [facility] appt. Was missed on 12/2 Re-Scheduled for 12/13 at 1;20pm. Transportation provided from [facility]. Transportation will arrive at 1pm at [address] [facility]

## 2023-12-02 NOTE — Patient Instructions (Signed)
Follow up with [facility] on 12/16 at 1:30pm [facility] 12/13 at 1:30 pm. Transportation from [address] [place]. Arriving on 12/13 at 1pk

## 2023-12-05 NOTE — Progress Notes (Unsigned)
This is not a provider visit. We have tried multiple times to change to a nurse visit.

## 2023-12-09 ENCOUNTER — Ambulatory Visit: Payer: Medicaid Other | Admitting: Nurse Practitioner

## 2023-12-09 NOTE — Progress Notes (Signed)
Fsbs 124 non-fasting

## 2023-12-09 NOTE — Progress Notes (Signed)
Pt has appt with Dr. Manson Passey today for clearance for cataract surgery. Plans to follow up on [facility] after appt.

## 2023-12-15 ENCOUNTER — Ambulatory Visit: Payer: Medicaid Other | Admitting: Internal Medicine

## 2023-12-15 NOTE — Progress Notes (Signed)
Pt is a returning patient to the [facility]. Patient has no concerns. Patient checked in to get a regular check up.  Patient BP is elevated bu within the patient's normal baseline. Awaiting appointment for a cataract surgery.  Michiel Cowboy Student Nurse

## 2023-12-30 ENCOUNTER — Ambulatory Visit: Payer: 59 | Admitting: Nurse Practitioner

## 2023-12-30 NOTE — Progress Notes (Signed)
 Returning pt. VSS. Awaiting cataract surgery scheduling.

## 2024-01-13 ENCOUNTER — Ambulatory Visit: Payer: 59 | Admitting: Nurse Practitioner

## 2024-01-13 NOTE — Progress Notes (Signed)
Returning pt. Pt has appt with cardiologist tomorrow at 2:15, and cataract surgery 02/05/24 at 0945.

## 2024-01-28 ENCOUNTER — Ambulatory Visit: Payer: 59

## 2024-01-28 LAB — GLUCOSE, POCT (MANUAL RESULT ENTRY): POC Glucose: 98 mg/dL (ref 70–99)

## 2024-01-28 NOTE — Progress Notes (Signed)
 Returning Pt. Pt has appt. 02/05/24 Thursday at 0945 left cataract surgery. Blankets given as requested.

## 2024-01-28 NOTE — Patient Instructions (Signed)
Pt has appt. or Cataract surgery on 02/05/24 at 0945

## 2024-02-03 ENCOUNTER — Ambulatory Visit: Payer: 59

## 2024-02-03 NOTE — Progress Notes (Unsigned)
Pt scheduled for cataract surgery 02/05/24 at 945. Concerned because he received a call this week and was told if he could not pay out of pocket they would not do the surgery. [facility] and spoke with Duwayne Heck and Inetta Fermo about scheduled surgery for 02/05/24. Current Medicaid card is not on file. Spoke with Duwayne Heck regarding pre-op instructions. Left a message for Inetta Fermo [phone] to verify Medicaid on file. Spoke with Jorge Ny [phone] who will provide transportation on Thursday.

## 2024-02-03 NOTE — Patient Instructions (Signed)
Pt appointment for 2/13 at 945 for cataract surgery. Picked up by [facility] at 0900. Surgery 9:45: Nothing to eat after Midnight, no morning meds, no mint or gum, Bring insurance and photo ID, wear button up shirt, transportation must stay during surgery.

## 2024-02-17 ENCOUNTER — Ambulatory Visit: Payer: 59

## 2024-02-17 NOTE — Progress Notes (Signed)
 Returning pt. Wants VS and CBG checked. Has right eye cataract surgery 02/19/24. Has transportation and reviewed pre-procedure checklist.

## 2024-03-04 ENCOUNTER — Telehealth (HOSPITAL_COMMUNITY): Payer: Self-pay | Admitting: Internal Medicine

## 2024-03-04 NOTE — Telephone Encounter (Signed)
 Front office called [employer] about referral sent over for this patient.   Front office spoke to an associate in referrals office and explained that this patient is not appropriate for this clinic and needs general cardiology.   Personnel will relay this message to [facility] staff.

## 2024-03-09 ENCOUNTER — Ambulatory Visit

## 2024-03-09 LAB — GLUCOSE, POCT (MANUAL RESULT ENTRY): POC Glucose: 94 mg/dL (ref 70–99)

## 2024-03-09 NOTE — Progress Notes (Signed)
 Returning pt. Requests assistance with PCP appointment, message left.   Foot and ankle appt on 3/24 with [facility]

## 2024-03-15 ENCOUNTER — Encounter: Admitting: Podiatry

## 2024-03-15 NOTE — Progress Notes (Signed)
Patient did not show for scheduled appointment today.

## 2024-03-22 ENCOUNTER — Ambulatory Visit: Attending: Cardiology | Admitting: Cardiology

## 2024-03-22 NOTE — Progress Notes (Deleted)
  Cardiology Office Note:  .   Date:  03/22/2024  ID:  Lewanda Rife, DOB May 18, 1956, MRN [id number] PCP: Lavinia Sharps, NP  [facility] Providers Cardiologist:  None { Click to update primary MD,subspecialty MD or APP then REFRESH:1}    No chief complaint on file.   Patient Profile: .     Nivek Powley is a *** 68 y.o. male *** with a PMH notable for *** who presents here for *** at the request of Estevan Oaks, NP.  {There is no content from the last Narrative History section.}      Whitley Strycharz was last seen on ***  Subjective  Discussed the use of AI scribe software for clinical note transcription with the patient, who gave verbal consent to proceed.  History of Present Illness            Cardiovascular ROS: {roscv:310661}  ROS:  Review of Systems - {ros master:310782}    Objective    Studies Reviewed: Marland Kitchen        ECHO: *** CATH: *** MONITOR: *** CT: ***  Risk Assessment/Calculations:   {Does this patient have ATRIAL FIBRILLATION?:[phone]} No BP recorded.  {Refresh Note OR Click here to enter BP  :1}***         Physical Exam:   VS:  There were no vitals taken for this visit.   Wt Readings from Last 3 Encounters:  03/09/24 169 lb 3.2 oz (76.7 kg)  02/17/24 163 lb 8 oz (74.2 kg)  01/28/24 169 lb (76.7 kg)    GEN: Well nourished, well developed in no acute distress; *** NECK: No JVD; No carotid bruits CARDIAC: Normal S1, S2; RRR, no murmurs, rubs, gallops RESPIRATORY:  Clear to auscultation without rales, wheezing or rhonchi ; nonlabored, good air movement. ABDOMEN: Soft, non-tender, non-distended EXTREMITIES:  No edema; No deformity      ASSESSMENT AND PLAN: .    Problem List Items Addressed This Visit   None   Assessment and Plan                {Are you ordering a CV Procedure (e.g. stress test, cath, DCCV, TEE, etc)?   Press F2        :[id number]}   Follow-Up: No follow-ups on file.  Total time spent: *** min  spent with patient + *** min spent charting = *** min    Signed, Marykay Lex, MD, MS Bryan Lemma, M.D., M.S. Interventional Cardiologist  [facility]  Pager # [phone] Phone # [phone] [address] [place], [place] [address]

## 2024-03-23 ENCOUNTER — Ambulatory Visit

## 2024-03-23 NOTE — Progress Notes (Unsigned)
 Returning pt.

## 2024-04-13 ENCOUNTER — Ambulatory Visit

## 2024-04-13 NOTE — Progress Notes (Unsigned)
 Returning pt. Wants VS checked. Pt has appt next Monday at foot doctor.

## 2024-04-19 ENCOUNTER — Ambulatory Visit (INDEPENDENT_AMBULATORY_CARE_PROVIDER_SITE_OTHER): Admitting: Podiatry

## 2024-04-19 DIAGNOSIS — M79674 Pain in right toe(s): Secondary | ICD-10-CM

## 2024-04-19 DIAGNOSIS — L84 Corns and callosities: Secondary | ICD-10-CM | POA: Diagnosis not present

## 2024-04-19 DIAGNOSIS — I739 Peripheral vascular disease, unspecified: Secondary | ICD-10-CM | POA: Diagnosis not present

## 2024-04-19 DIAGNOSIS — B351 Tinea unguium: Secondary | ICD-10-CM

## 2024-04-19 DIAGNOSIS — M79675 Pain in left toe(s): Secondary | ICD-10-CM | POA: Diagnosis not present

## 2024-04-19 NOTE — Progress Notes (Unsigned)
       Subjective:  Patient ID: Alroy Bernhard, male    DOB: [birth year],  MRN: [id number]  Lorenso Ellman presents to clinic today for:  Chief Complaint  Patient presents with   Nail Problem    Pt stated that his nails are long and thick he stated that he does have some callus    Patient notes nails are thick and elongated, causing pain in shoe gear when ambulating.  He also has painful calluses bilateral submet 5  PCP is Placey, Kevon Pellegrini, NP.  Last seen around 01/28/2024  History reviewed. No pertinent past medical history.  Allergies  Allergen Reactions   Penicillins Nausea And Vomiting    Objective:  Jeremey Diguglielmo is a pleasant 68 y.o. male in NAD. AAO x 3.  Vascular Examination: Patient has palpable DP pulse, absent PT pulse bilateral.  Delayed capillary refill bilateral toes.  Sparse digital hair bilateral.  Proximal to distal cooling WNL bilateral.    Dermatological Examination: Interspaces are clear with no open lesions noted bilateral.  Skin is shiny and atrophic bilateral.  Nails are 3-63mm thick, with yellowish/brown discoloration, subungual debris and distal onycholysis x10.  There is pain with compression of nails x10.  There are hyperkeratotic lesions noted bilateral submet 5.  Patient qualifies for at-risk foot care because of PVD.  Assessment/Plan: 1. Pain due to onychomycosis of toenails of both feet   2. Callus of foot   3. PVD (peripheral vascular disease) (HCC)    Mycotic nails x10 were sharply debrided with sterile nail nippers and power debriding burr to decrease bulk and length.  Hyperkeratotic lesions x 2, bilateral submet 5, were shaved with #312 blade.   Return in about 3 months (around 07/19/2024) for [facility].   Joe Murders, DPM, FACFAS [facility]     [address] [place], [place] [address]                Office [phone]  Fax [phone]

## 2024-04-20 ENCOUNTER — Encounter: Payer: Self-pay | Admitting: Podiatry

## 2024-04-27 ENCOUNTER — Ambulatory Visit

## 2024-04-27 NOTE — Progress Notes (Unsigned)
 Returning pt. Pt reports needing to pick up medications. Bus pass given and bus identified to [facility] on [address] . Socks and shoes provided from donations.

## 2024-05-04 ENCOUNTER — Ambulatory Visit

## 2024-05-04 NOTE — Progress Notes (Signed)
 Returning pt. Requests VS and CBG, reviewed upcoming appts. And pt informed of upcoming community event this Saturday.

## 2024-06-08 ENCOUNTER — Ambulatory Visit

## 2024-06-08 LAB — GLUCOSE, POCT (MANUAL RESULT ENTRY): POC Glucose: 80 mg/dL (ref 70–99)

## 2024-06-08 NOTE — Progress Notes (Signed)
 Nursing Intake Note - Returning Patient  [employer], [employer] services [employer]  Chief Complaint: Vital Signs Living Situation: Housed Insurance Status: Medicaid, Medicare    Patient Status:  [x]  Returning patient to [employer]  Confirmed demographics and emergency contact info  HIPAA and privacy policies previously reviewed  Consent for digital charting: []  Previously Signed [x]  Signed Today []  Not Signed  Additional Notes:  Vital signs taken and entered in flowsheet  Interpreter services: []  Needed [x]  Not Needed  Brief SDOH update completed  Referral to provider: []  Needed []  Not Needed  RN Interventions Provided Today:    [x]  Other: Glasses provided

## 2024-07-20 ENCOUNTER — Encounter: Admitting: Podiatry

## 2024-07-20 NOTE — Progress Notes (Signed)
 Patient did not show for his scheduled appointment this morning

## 2024-07-27 ENCOUNTER — Ambulatory Visit

## 2024-07-27 ENCOUNTER — Ambulatory Visit: Admitting: Cardiology

## 2024-07-27 LAB — GLUCOSE, POCT (MANUAL RESULT ENTRY): POC Glucose: 91 mg/dL (ref 70–99)

## 2024-07-27 NOTE — Progress Notes (Unsigned)
 Pt has scheduled EKG today at 1100 am. Monday 09/13/24 at 0820 am. Please arrive at 0800  [address]. [address]. [place], [place] [address]

## 2024-07-27 NOTE — Progress Notes (Unsigned)
 Nursing Intake Note - Returning Patient  [facility]  Chief Complaint: No chief complaint on file.  Living Situation: unhoused  Patient Status:  [x]  Returning patient to [facility]  Confirmed demographics and emergency contact info  HIPAA and privacy policies previously reviewed  Consent for digital charting: [x]  Previously Signed []  Signed Today []  Not Signed  Additional Notes:  Vital signs taken and entered in flowsheet  Interpreter services: []  Needed [x]  Not Needed  Brief SDOH update completed  Referral to provider: []  Needed [x]  Not Needed  RN Interventions Provided Today:  [x]  Health education (e.g., chronic disease, hygiene, nutrition)  []  Wound care performed  []  Flu vaccine administered  [x]  Other: assisted patient with changing appointment

## 2024-08-17 ENCOUNTER — Ambulatory Visit

## 2024-08-17 LAB — GLUCOSE, POCT (MANUAL RESULT ENTRY): POC Glucose: 74 mg/dL (ref 70–99)

## 2024-09-09 NOTE — Progress Notes (Deleted)
    Cardiology Office Note Date:  09/09/2024  ID:  Richard Nguyen, DOB Sep 21, 1956, MRN [id number] PCP:  Scarlett Ronal Caldron, NP  Cardiologist:  Richard VEAR Ren Donley, MD  No chief complaint on file.   History of Present Illness: Richard Nguyen is a 68 y.o. male who presents for CP.  ROS: Please see the history of present illness. All other systems are reviewed and negative.   Past Medical History:  Diagnosis Date   Angina pectoris (HCC)    BMI 27.0-27.9,adult    Cataracts, bilateral    Cocaine abuse (HCC)    Depression    GAD (generalized anxiety disorder)    Memory impairment    Onychomycosis    Overweight    Pain in foot    Vitamin D deficiency     No past surgical history on file.  Current Outpatient Medications  Medication Sig Dispense Refill   acetaminophen  (TYLENOL ) 500 MG tablet Take 500 mg by mouth every 6 (six) hours as needed. (Patient not taking: Reported on 08/17/2024)     ergocalciferol (VITAMIN D2) 1.25 MG (50000 UT) capsule Take 50,000 Units by mouth once a week. (Patient not taking: Reported on 08/17/2024)     hydrOXYzine (VISTARIL) 25 MG capsule Take 25 mg by mouth 3 (three) times daily as needed. (Patient not taking: Reported on 08/17/2024)     ketoconazole  (NIZORAL ) 2 % cream Apply 1 Application topically daily. (Patient not taking: Reported on 08/17/2024) 60 g 3   nitroGLYCERIN (NITROSTAT) 0.4 MG SL tablet Place under the tongue. (Patient not taking: Reported on 08/17/2024)     sertraline (ZOLOFT) 50 MG tablet Take 75 mg by mouth daily. (Patient not taking: Reported on 08/17/2024)     simvastatin (ZOCOR) 10 MG tablet SMARTSIG:1 pill By Mouth Every Evening (Patient not taking: Reported on 08/17/2024)     No current facility-administered medications for this visit.    Allergies:   Penicillins   Social History:  ***  Family History:  ***  PHYSICAL EXAM: VS:  There were no vitals taken for this visit. , BMI There is no height or weight on file to calculate  BMI. GEN: Well nourished, well developed, in no acute distress HEENT: normal Neck: no JVD, carotid bruits, or masses Cardiac: ***RRR; no murmurs, rubs, or gallops,no edema  Respiratory:  CTAB bilaterally, normal work of breathing GI: soft, nontender, nondistended, + BS Extremities: No LE edema Skin: warm and dry, no rash Neuro:  Strength and sensation are intact  EKG: ***  Recent Labs: No results found for requested labs within last 365 days.   No results found for: CHOL, TRIG, HDL, CHOLHDL, VLDL, LDLCALC, LDLDIRECT   Wt Readings from Last 5 Encounters:  08/17/24 163 lb (73.9 kg)  07/27/24 159 lb 4.8 oz (72.3 kg)  04/27/24 151 lb (68.5 kg)  04/13/24 159 lb (72.1 kg)  03/23/24 159 lb (72.1 kg)     BP Readings from Last 5 Encounters:  08/17/24 (!) 140/80  07/27/24 128/81  06/08/24 122/80  04/27/24 115/74  04/13/24 118/78    Studies: Reviewed  ASSESSMENT AND PLAN: Bueford Arp is a 68 y.o. male who presents for CP.  #*** #*** #*** #***   Signed, Richard VEAR Ren Donley, MD  09/09/2024 3:48 PM    [facility]

## 2024-09-13 ENCOUNTER — Ambulatory Visit

## 2024-09-13 DIAGNOSIS — F1494 Cocaine use, unspecified with cocaine-induced mood disorder: Secondary | ICD-10-CM

## 2024-09-13 DIAGNOSIS — E785 Hyperlipidemia, unspecified: Secondary | ICD-10-CM

## 2024-09-13 DIAGNOSIS — R0789 Other chest pain: Secondary | ICD-10-CM

## 2024-09-13 DIAGNOSIS — E663 Overweight: Secondary | ICD-10-CM

## 2024-10-12 ENCOUNTER — Ambulatory Visit: Admitting: Family Medicine

## 2024-10-12 DIAGNOSIS — Z23 Encounter for immunization: Secondary | ICD-10-CM

## 2024-10-12 NOTE — Progress Notes (Signed)
 Nursing Intake Note - Returning Patient  [employer]ed [employer] and education officer  Chief Complaint: Flu Shot  Living Situation: Housed Insurance Status:  Medicare   Patient Status:  [x]  Returning patient to [employer]  Confirmed demographics and emergency contact info  HIPAA and privacy policies previously reviewed  Consent for digital charting: [x]  Previously Signed []  Signed Today []  Not Signed  Additional Notes:  Vital signs taken and entered in flowsheet  Interpreter services: []  Needed [x]  Not Needed  Brief SDOH update completed  Referral to provider: []  Needed [x]  Not Needed  RN Interventions Provided Today:  [x]  Health education (e.g., chronic disease, hygiene, nutrition)  [x]  Flu vaccine administered

## 2024-10-26 ENCOUNTER — Ambulatory Visit: Admitting: Family Medicine

## 2024-10-26 LAB — GLUCOSE, POCT (MANUAL RESULT ENTRY): POC Glucose: 96 mg/dL (ref 70–99)

## 2024-11-30 NOTE — Progress Notes (Incomplete)
  Cardiology Office Note:   Date:  11/30/2024  ID:  Richard Nguyen, DOB 1956-06-18, MRN [id number] PCP: Scarlett Ronal Caldron, NP  [facility] Providers Cardiologist:  None { Chief Complaint: No chief complaint on file.     History of Present Illness:   Richard Nguyen is a 68 y.o. male with a PMH of bilateral cataracts, and MDD who presents as a new patient referral by Rojelio Daring for the evaluation of chest pain.   Past Medical History:  Diagnosis Date   Angina pectoris    BMI 27.0-27.9,adult    Cataracts, bilateral    Cocaine abuse (HCC)    Depression    GAD (generalized anxiety disorder)    Memory impairment    Onychomycosis    Overweight    Pain in foot    Vitamin D deficiency      Studies Reviewed:    EKG: ***           Risk Assessment/Calculations:   {Does this patient have ATRIAL FIBRILLATION?:[phone]} No BP recorded.  {Refresh Note OR Click here to enter BP  :1}***        Physical Exam:     VS:  There were no vitals taken for this visit. ***    Wt Readings from Last 3 Encounters:  10/26/24 154 lb (69.9 kg)  08/17/24 163 lb (73.9 kg)  07/27/24 159 lb 4.8 oz (72.3 kg)     GEN: Well nourished, well developed, in no acute distress NECK: No JVD; No carotid bruits CARDIAC: ***RRR, no murmurs, rubs, gallops RESPIRATORY:  Clear to auscultation without rales, wheezing or rhonchi  ABDOMEN: Soft, non-tender, non-distended, normal bowel sounds EXTREMITIES:  Warm and well perfused, no edema; No deformity, 2+ radial pulses PSYCH: Normal mood and affect   Assessment & Plan       {Are you ordering a CV Procedure (e.g. stress test, cath, DCCV, TEE, etc)?   Press F2        :[id number]}   This note was written with the assistance of a dictation microphone or AI dictation software. Please excuse any typos or grammatical errors.   Signed, Georganna Archer, MD 11/30/2024 11:32 AM    [facility]

## 2024-12-01 ENCOUNTER — Ambulatory Visit
Attending: Student in an Organized Health Care Education/Training Program | Admitting: Student in an Organized Health Care Education/Training Program

## 2024-12-28 ENCOUNTER — Ambulatory Visit

## 2024-12-28 NOTE — Progress Notes (Signed)
 Nursing Intake Note - Returning Patient  [employer]  Chief Complaint:  Chief Complaint  Patient presents with   Blood Pressure Check   Living Situation: Has Room  Insurance Status:  Medicaid   Patient Status:  [x]  Returning patient to [facility]  Confirmed demographics and emergency contact info  HIPAA and privacy policies previously reviewed  Consent for digital charting: [x]  Previously Signed []  Signed Today []  Not Signed  Additional Notes:  Vital signs taken and entered in flowsheet  Interpreter services: []  Needed [x]  Not Needed  Brief SDOH update completed  Referral to provider: []  Needed [x]  Not Needed  RN Interventions Provided Today:  [x]  Health education (e.g., chronic disease, hygiene, nutrition)  [x]  Other: BP re-check. States BP elevated at cardiologist appt. Last week.

## 2025-01-14 ENCOUNTER — Encounter: Payer: Self-pay | Admitting: Cardiology
